# Patient Record
Sex: Male | Born: 1965 | Race: White | Hispanic: No | Marital: Single | Smoking: Never smoker
Health system: Southern US, Community
[De-identification: ages and names within clinical notes are randomized; demographics above are authoritative.]

## PROBLEM LIST (undated history)

## (undated) DIAGNOSIS — E079 Disorder of thyroid, unspecified: Secondary | ICD-10-CM

## (undated) DIAGNOSIS — K589 Irritable bowel syndrome without diarrhea: Secondary | ICD-10-CM

## (undated) DIAGNOSIS — K219 Gastro-esophageal reflux disease without esophagitis: Secondary | ICD-10-CM

## (undated) HISTORY — DX: Irritable bowel syndrome, unspecified: K58.9

## (undated) HISTORY — DX: Gastro-esophageal reflux disease without esophagitis: K21.9

## (undated) HISTORY — PX: THYROID LOBECTOMY: SHX420

## (undated) HISTORY — PX: APPENDECTOMY: SHX54

## (undated) HISTORY — DX: Disorder of thyroid, unspecified: E07.9

---

## 2003-09-21 ENCOUNTER — Other Ambulatory Visit: Admission: RE | Admit: 2003-09-21 | Discharge: 2003-09-21 | Payer: Self-pay | Admitting: Diagnostic Radiology

## 2003-10-13 ENCOUNTER — Observation Stay (HOSPITAL_COMMUNITY): Admission: RE | Admit: 2003-10-13 | Discharge: 2003-10-14 | Payer: Self-pay | Admitting: General Surgery

## 2003-10-13 ENCOUNTER — Encounter (INDEPENDENT_AMBULATORY_CARE_PROVIDER_SITE_OTHER): Payer: Self-pay | Admitting: Specialist

## 2004-07-04 ENCOUNTER — Encounter: Admission: RE | Admit: 2004-07-04 | Discharge: 2004-07-04 | Payer: Self-pay | Admitting: Family Medicine

## 2008-04-06 ENCOUNTER — Ambulatory Visit (HOSPITAL_BASED_OUTPATIENT_CLINIC_OR_DEPARTMENT_OTHER): Admission: RE | Admit: 2008-04-06 | Discharge: 2008-04-06 | Payer: Self-pay | Admitting: Orthopedic Surgery

## 2010-09-12 NOTE — Op Note (Signed)
Henry Ball, Henry Ball NO.:  [id number]   MEDICAL RECORD NO.:  [id number]          PATIENT TYPE:  AMB   LOCATION:  [facility]                         FACILITY:  [facility]   PHYSICIAN:  Deidre Ala, M.D.    DATE OF BIRTH:  1965-10-16   DATE OF PROCEDURE:  04/06/2008  DATE OF DISCHARGE:                               OPERATIVE REPORT   PREOPERATIVE DIAGNOSES:  1. Impingement syndrome right shoulder with type 2-3 acromion.  2. Osteoarthritis acromioclavicular joint.  3. Rule out partial-thickness rotator cuff tear.   POSTOPERATIVE DIAGNOSES:  1. Impingement syndrome right shoulder with type 2-3 acromion.  2. 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  3. Mild biceps tendinitis.  4. Extensive partial-thickness rotator cuff abrasion and tearing pass      critical zone.   SURGICAL PROCEDURES:  1. Right shoulder operative arthroscopy with subacromial arch      decompression acromioplasty.  2. Debridement and ablation of partial-thickness rotator cuff tear.  3. Arthroscopic distal clavicle resection.  4. Subdeltoid bursectomy.   SURGEON:  1. Charlesetta Shanks, M.D.   ASSISTANT:  Phineas Semen, P.A.C.   ANESTHESIA:  General with LMA with scalene nerve block.   CULTURES:  None.   DRAINS:  None.   BLOOD LOSS:  Minimal.   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  He had shoulder pain starting a year and a half ago.  We have injected him with cortisone and Marcaine.  The pain has been  getting worse with positive impingement signs, tenderness at the [facility]  joint, pain on adduction at the [facility] joint.  MRI scan showed supraspinatus  and infraspinatus tendinopathy without tear with mild acromioclavicular  degenerative change.  There was no through-and-through tear.  There was  no SLAP tear identified.  He desired to proceed and was approved by  worker's comp for surgery as he was on light duty work because of  this  with restrictions.  At surgery, he had no SLAP tear with some fraying.  The biceps tendon, when pulled into the joint, had some redness in the  bicipital groove but it was not severe enough to do tenotomy.  His  glenohumeral joint looked good.  The anterior acromion was sharp, hooked  and inflamed on the inferior leading edge over the CA ligament which was  prominent.  The [facility] joint was markedly end-stage osteoarthritic.  He had  thick subdeltoid bursa and extensive excoriation of the supraspinatus  tendon all over, past the critical zone with the impingement to probably  the superficial one-third but not through-and-through and intact to the  tuberosity.  We did SADDCR to Caspari margins.  We debrided the  subdeltoid bursa as well as smoothed with the ablator on one, the  rotator cuff and released the CA ligament.  We did some minor shaving on  the superior labrum.  Biceps tenotomy was not done.   PROCEDURE:  With adequate anesthesia obtained using endotracheal  technique and scalene block, the patient was placed in the supine beach  chair position.  The right shoulder was then prepped and draped in the  standard fashion.  After standard prepping and draping, skin markings  were made for anatomic positioning.  I then entered the shoulder through  posterior portal.  Anterior portal was established just lateral to the  coracoid.  The superior labral area was then probed and shaver was  brought in as well as ablator.  Portals reversed and similar shavings  carried out.  I then entered the subacromial space at the posterior  portal, lateral portal was established.  I then shaved the soft tissue  from the anterior undersurface of the acromion and lysed the CA ligament  using an electrocautery.  Bleeding points were cauterized.  Ablator was  used to cauterize the anterior undersurface of the acromion before  shaving.  I then brought in the 6 bur and completed acromioplasty of the  roof  of the subacromial space in the manner of Caspari.  The scope was  then turned medially sideways where, through the anterior portal, I  debrided the [facility] meniscus with basket.  Collene Mares was then brought in.  I  then brought in a 6 bur and completed distal clavicle resection 2  shaverbreadths in in the manner of Caspari.  I then entered the shoulder  from the lateral portal, completed acromioplasty back to the bicortical  bone in the manner of Caspari and further lightly shaved on the distal  clavicle.  The scope was turned downward and with neutral internal-  external rotation, I then shaved the bursa and smoothed with the ablator  on 1.  I shaved the partial-thickness rotator cuff tear to a stable  zone, again staying superficial but using the ablator on 1 to further  smooth.  When adequate decompression was carried out, the shoulder was  irrigated through the scope, 0.5% Marcaine was not used due to the  block.  The portals closed with 4-0 nylon.  A bulky sterile compressive  dressing was applied with sling.  The patient, having tolerated the  procedure well, was awakened, taken to the recovery room in satisfactory  condition to be discharged per outpatient routine, given Percocet for  pain and told to call the office for appointment for recheck tomorrow.      Deidre Ala, M.D.  Electronically Signed     VEP/MEDQ  D:  04/06/2008  T:  04/06/2008  Job:  [id number]

## 2010-09-15 NOTE — Op Note (Signed)
NAME:  Henry Ball, Henry Ball                        ACCOUNT NO.:  [id number]   MEDICAL RECORD NO.:  [id number]                   PATIENT TYPE:  AMB   LOCATION:  DAY                                  FACILITY:  [facility]   PHYSICIAN:  Gita Kudo, M.D.              DATE OF BIRTH:  11-13-1965   DATE OF PROCEDURE:  10/13/2003  DATE OF DISCHARGE:                                 OPERATIVE REPORT   OPERATIVE PROCEDURE:  Left thyroid lobectomy, frozen section.   SURGEON:  Gita Kudo, M.D.   ANESTHESIA:  General endotracheal.   PREOPERATIVE DIAGNOSIS:  Mass, left lower pole thyroid.   POSTOPERATIVE DIAGNOSIS:  Mass, left lower pole thyroid, probably benign by  frozen section.   CLINICAL SUMMARY:  A 45 year old male with lump in the neck.  No real  symptoms.  Work-up included exam, ultrasound, FNA - all inconclusive.  Comes  in for excision.   OPERATIVE FINDINGS:  The patient's right lobe of the thyroid gland looked  perfectly normal and felt normal.  The left lobe was enlarged with a  dominant nodule in the lower portion that really extended up to the upper  portion of the pole.  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   OPERATIVE PROCEDURE:  Under satisfactory general endotracheal anesthesia,  the patient's neck was prepped and draped in the standard fashion, and he  was positioned appropriately.  A transverse incision was made centered over  the mass and symmetrical on either side of midline.  Following this, the  cautery was used to develop flaps and good exposure obtained with a self-  retaining retractor.  The midline was opened and the strap muscles  retracted.  Bleeders were coagulated or tied with silk.  The nodule was  easily identified.  The lobe was retracted medially and with good exposure,  staying close to the gland; small branches were divided by cautery or  controlled with metal clips and divided.  Then, the inferior vein and middle  vein were divided between ties and clips.  The superior pole was taken down  and divided between 2-0 silk ties and clips also.  Then branches of the  inferior artery were identified and divided close to the gland between  clips.  At this point, the parathyroids and recurrent nerves were identified  and kept away from the operative field and injury prevented.  Then the gland  was removed from the trachea with the cautery over to the isthmus.  The  isthmus was controlled with a clamp and the gland divided, marked with  suture, and sent for pathologic exam.  The isthmus was controlled with  figure-of-eight 3-0 Vicryl suture.  Then the right side was examined  carefully, visually inspected and felt and felt normal.  While waiting for  pathology, the wound was made dry by cautery, infiltrated with Marcaine.  A  piece of Surgicel  placed on the  operative site and then the wound closed in layers with interrupted 3-0  Vicryl suture for midline, platysma, subcu.  When the report came back from  pathology, the wound skin edges were approximated with staples and Steri-  Strips.  A sterile absorbent dressing was applied, and the patient went to  the recovery room in good condition.                                               Gita Kudo, M.D.    MRL/MEDQ  D:  10/13/2003  T:  10/13/2003  Job:  [id number]   cc:   Chales Salmon. Abigail Miyamoto, M.D.  [address]  [place]  [place] [address]  Fax: [phone]

## 2011-02-02 LAB — POCT HEMOGLOBIN-HEMACUE: Hemoglobin: 16.1 g/dL (ref 13.0–17.0)

## 2011-06-14 ENCOUNTER — Other Ambulatory Visit: Payer: Self-pay | Admitting: Family Medicine

## 2011-06-14 DIAGNOSIS — J329 Chronic sinusitis, unspecified: Secondary | ICD-10-CM

## 2011-06-25 ENCOUNTER — Ambulatory Visit
Admission: RE | Admit: 2011-06-25 | Discharge: 2011-06-25 | Disposition: A | Discharge status: Home / Self Care | Payer: Federal, State, Local not specified - PPO | Source: Ambulatory Visit | Attending: Family Medicine | Admitting: Family Medicine

## 2011-06-25 DIAGNOSIS — J329 Chronic sinusitis, unspecified: Secondary | ICD-10-CM

## 2011-11-02 ENCOUNTER — Other Ambulatory Visit: Payer: Self-pay | Admitting: Family Medicine

## 2011-11-02 DIAGNOSIS — R200 Anesthesia of skin: Secondary | ICD-10-CM

## 2011-11-02 DIAGNOSIS — M79609 Pain in unspecified limb: Secondary | ICD-10-CM

## 2011-11-05 ENCOUNTER — Other Ambulatory Visit: Payer: Self-pay | Admitting: Family Medicine

## 2011-11-05 DIAGNOSIS — M79609 Pain in unspecified limb: Secondary | ICD-10-CM

## 2011-11-09 ENCOUNTER — Other Ambulatory Visit: Payer: Federal, State, Local not specified - PPO

## 2014-01-08 ENCOUNTER — Encounter: Payer: Self-pay | Admitting: *Deleted

## 2014-03-11 ENCOUNTER — Other Ambulatory Visit: Payer: Self-pay | Admitting: Family Medicine

## 2014-03-11 ENCOUNTER — Ambulatory Visit
Admission: RE | Admit: 2014-03-11 | Discharge: 2014-03-11 | Disposition: A | Discharge status: Home / Self Care | Payer: Federal, State, Local not specified - PPO | Source: Ambulatory Visit | Attending: Family Medicine | Admitting: Family Medicine

## 2014-03-11 DIAGNOSIS — M79672 Pain in left foot: Secondary | ICD-10-CM

## 2015-10-24 DIAGNOSIS — L739 Follicular disorder, unspecified: Secondary | ICD-10-CM | POA: Diagnosis not present

## 2015-10-24 DIAGNOSIS — L259 Unspecified contact dermatitis, unspecified cause: Secondary | ICD-10-CM | POA: Diagnosis not present

## 2015-12-01 DIAGNOSIS — K589 Irritable bowel syndrome without diarrhea: Secondary | ICD-10-CM | POA: Diagnosis not present

## 2015-12-16 DIAGNOSIS — H18223 Idiopathic corneal edema, bilateral: Secondary | ICD-10-CM | POA: Diagnosis not present

## 2016-02-02 DIAGNOSIS — M79606 Pain in leg, unspecified: Secondary | ICD-10-CM | POA: Diagnosis not present

## 2016-02-14 DIAGNOSIS — L821 Other seborrheic keratosis: Secondary | ICD-10-CM | POA: Diagnosis not present

## 2016-02-14 DIAGNOSIS — L814 Other melanin hyperpigmentation: Secondary | ICD-10-CM | POA: Diagnosis not present

## 2016-02-14 DIAGNOSIS — L281 Prurigo nodularis: Secondary | ICD-10-CM | POA: Diagnosis not present

## 2016-02-14 DIAGNOSIS — D485 Neoplasm of uncertain behavior of skin: Secondary | ICD-10-CM | POA: Diagnosis not present

## 2016-02-14 DIAGNOSIS — L309 Dermatitis, unspecified: Secondary | ICD-10-CM | POA: Diagnosis not present

## 2016-02-14 DIAGNOSIS — D1801 Hemangioma of skin and subcutaneous tissue: Secondary | ICD-10-CM | POA: Diagnosis not present

## 2016-02-28 DIAGNOSIS — L300 Nummular dermatitis: Secondary | ICD-10-CM | POA: Diagnosis not present

## 2016-05-01 DIAGNOSIS — L821 Other seborrheic keratosis: Secondary | ICD-10-CM | POA: Diagnosis not present

## 2016-05-01 DIAGNOSIS — L300 Nummular dermatitis: Secondary | ICD-10-CM | POA: Diagnosis not present

## 2016-05-03 DIAGNOSIS — R05 Cough: Secondary | ICD-10-CM | POA: Diagnosis not present

## 2016-05-11 DIAGNOSIS — J321 Chronic frontal sinusitis: Secondary | ICD-10-CM | POA: Diagnosis not present

## 2016-10-26 DIAGNOSIS — E786 Lipoprotein deficiency: Secondary | ICD-10-CM | POA: Diagnosis not present

## 2016-10-26 DIAGNOSIS — E291 Testicular hypofunction: Secondary | ICD-10-CM | POA: Diagnosis not present

## 2016-10-26 DIAGNOSIS — I10 Essential (primary) hypertension: Secondary | ICD-10-CM | POA: Diagnosis not present

## 2016-11-02 DIAGNOSIS — Z0000 Encounter for general adult medical examination without abnormal findings: Secondary | ICD-10-CM | POA: Diagnosis not present

## 2016-11-02 DIAGNOSIS — R944 Abnormal results of kidney function studies: Secondary | ICD-10-CM | POA: Diagnosis not present

## 2016-11-16 DIAGNOSIS — Z1211 Encounter for screening for malignant neoplasm of colon: Secondary | ICD-10-CM | POA: Diagnosis not present

## 2016-11-16 DIAGNOSIS — Z01818 Encounter for other preprocedural examination: Secondary | ICD-10-CM | POA: Diagnosis not present

## 2016-11-29 DIAGNOSIS — N289 Disorder of kidney and ureter, unspecified: Secondary | ICD-10-CM | POA: Diagnosis not present

## 2016-11-29 DIAGNOSIS — I10 Essential (primary) hypertension: Secondary | ICD-10-CM | POA: Diagnosis not present

## 2016-11-30 ENCOUNTER — Other Ambulatory Visit: Payer: Self-pay | Admitting: Family Medicine

## 2016-11-30 DIAGNOSIS — N289 Disorder of kidney and ureter, unspecified: Secondary | ICD-10-CM

## 2016-12-03 DIAGNOSIS — K635 Polyp of colon: Secondary | ICD-10-CM | POA: Diagnosis not present

## 2016-12-03 DIAGNOSIS — Z1211 Encounter for screening for malignant neoplasm of colon: Secondary | ICD-10-CM | POA: Diagnosis not present

## 2016-12-03 DIAGNOSIS — K219 Gastro-esophageal reflux disease without esophagitis: Secondary | ICD-10-CM | POA: Diagnosis not present

## 2016-12-03 DIAGNOSIS — K2930 Chronic superficial gastritis without bleeding: Secondary | ICD-10-CM | POA: Diagnosis not present

## 2016-12-03 DIAGNOSIS — D126 Benign neoplasm of colon, unspecified: Secondary | ICD-10-CM | POA: Diagnosis not present

## 2016-12-03 DIAGNOSIS — K640 First degree hemorrhoids: Secondary | ICD-10-CM | POA: Diagnosis not present

## 2016-12-04 ENCOUNTER — Other Ambulatory Visit: Payer: Self-pay | Admitting: Gastroenterology

## 2016-12-04 DIAGNOSIS — K6389 Other specified diseases of intestine: Secondary | ICD-10-CM

## 2016-12-06 DIAGNOSIS — B349 Viral infection, unspecified: Secondary | ICD-10-CM | POA: Diagnosis not present

## 2016-12-06 DIAGNOSIS — J309 Allergic rhinitis, unspecified: Secondary | ICD-10-CM | POA: Diagnosis not present

## 2016-12-07 DIAGNOSIS — K635 Polyp of colon: Secondary | ICD-10-CM | POA: Diagnosis not present

## 2016-12-07 DIAGNOSIS — D126 Benign neoplasm of colon, unspecified: Secondary | ICD-10-CM | POA: Diagnosis not present

## 2016-12-07 DIAGNOSIS — K2930 Chronic superficial gastritis without bleeding: Secondary | ICD-10-CM | POA: Diagnosis not present

## 2016-12-07 DIAGNOSIS — Z1211 Encounter for screening for malignant neoplasm of colon: Secondary | ICD-10-CM | POA: Diagnosis not present

## 2016-12-10 ENCOUNTER — Ambulatory Visit
Admission: RE | Admit: 2016-12-10 | Discharge: 2016-12-10 | Disposition: A | Discharge status: Home / Self Care | Payer: Federal, State, Local not specified - PPO | Source: Ambulatory Visit | Attending: Family Medicine | Admitting: Family Medicine

## 2016-12-10 DIAGNOSIS — R944 Abnormal results of kidney function studies: Secondary | ICD-10-CM | POA: Diagnosis not present

## 2016-12-10 DIAGNOSIS — N289 Disorder of kidney and ureter, unspecified: Secondary | ICD-10-CM

## 2016-12-21 ENCOUNTER — Ambulatory Visit
Admission: RE | Admit: 2016-12-21 | Discharge: 2016-12-21 | Disposition: A | Discharge status: Home / Self Care | Payer: Federal, State, Local not specified - PPO | Source: Ambulatory Visit | Attending: Gastroenterology | Admitting: Gastroenterology

## 2016-12-21 DIAGNOSIS — K6389 Other specified diseases of intestine: Secondary | ICD-10-CM

## 2016-12-21 DIAGNOSIS — K7689 Other specified diseases of liver: Secondary | ICD-10-CM | POA: Diagnosis not present

## 2016-12-21 MED ORDER — IOPAMIDOL (ISOVUE-300) INJECTION 61%
100.0000 mL | Freq: Once | INTRAVENOUS | Status: AC | PRN
  Administered 2016-12-21: 100 mL via INTRAVENOUS

## 2016-12-28 DIAGNOSIS — J0100 Acute maxillary sinusitis, unspecified: Secondary | ICD-10-CM | POA: Diagnosis not present

## 2016-12-28 DIAGNOSIS — I10 Essential (primary) hypertension: Secondary | ICD-10-CM | POA: Diagnosis not present

## 2016-12-28 DIAGNOSIS — N289 Disorder of kidney and ureter, unspecified: Secondary | ICD-10-CM | POA: Diagnosis not present

## 2017-02-27 DIAGNOSIS — Z79899 Other long term (current) drug therapy: Secondary | ICD-10-CM | POA: Diagnosis not present

## 2017-03-26 DIAGNOSIS — I10 Essential (primary) hypertension: Secondary | ICD-10-CM | POA: Diagnosis not present

## 2017-07-03 DIAGNOSIS — E039 Hypothyroidism, unspecified: Secondary | ICD-10-CM | POA: Diagnosis not present

## 2017-07-03 DIAGNOSIS — I129 Hypertensive chronic kidney disease with stage 1 through stage 4 chronic kidney disease, or unspecified chronic kidney disease: Secondary | ICD-10-CM | POA: Diagnosis not present

## 2017-07-03 DIAGNOSIS — K219 Gastro-esophageal reflux disease without esophagitis: Secondary | ICD-10-CM | POA: Diagnosis not present

## 2017-07-03 DIAGNOSIS — N183 Chronic kidney disease, stage 3 (moderate): Secondary | ICD-10-CM | POA: Diagnosis not present

## 2017-09-19 DIAGNOSIS — J309 Allergic rhinitis, unspecified: Secondary | ICD-10-CM | POA: Diagnosis not present

## 2017-09-19 DIAGNOSIS — E039 Hypothyroidism, unspecified: Secondary | ICD-10-CM | POA: Diagnosis not present

## 2017-09-19 DIAGNOSIS — I10 Essential (primary) hypertension: Secondary | ICD-10-CM | POA: Diagnosis not present

## 2017-09-19 DIAGNOSIS — N289 Disorder of kidney and ureter, unspecified: Secondary | ICD-10-CM | POA: Diagnosis not present

## 2017-09-19 DIAGNOSIS — L918 Other hypertrophic disorders of the skin: Secondary | ICD-10-CM | POA: Diagnosis not present

## 2018-01-04 DIAGNOSIS — M545 Low back pain: Secondary | ICD-10-CM | POA: Diagnosis not present

## 2018-01-17 DIAGNOSIS — Z13220 Encounter for screening for lipoid disorders: Secondary | ICD-10-CM | POA: Diagnosis not present

## 2018-01-17 DIAGNOSIS — Z0000 Encounter for general adult medical examination without abnormal findings: Secondary | ICD-10-CM | POA: Diagnosis not present

## 2018-01-23 DIAGNOSIS — I10 Essential (primary) hypertension: Secondary | ICD-10-CM | POA: Diagnosis not present

## 2018-01-23 DIAGNOSIS — E039 Hypothyroidism, unspecified: Secondary | ICD-10-CM | POA: Diagnosis not present

## 2018-01-23 DIAGNOSIS — Z23 Encounter for immunization: Secondary | ICD-10-CM | POA: Diagnosis not present

## 2018-01-23 DIAGNOSIS — E786 Lipoprotein deficiency: Secondary | ICD-10-CM | POA: Diagnosis not present

## 2018-01-23 DIAGNOSIS — Z0000 Encounter for general adult medical examination without abnormal findings: Secondary | ICD-10-CM | POA: Diagnosis not present

## 2018-02-04 DIAGNOSIS — I129 Hypertensive chronic kidney disease with stage 1 through stage 4 chronic kidney disease, or unspecified chronic kidney disease: Secondary | ICD-10-CM | POA: Diagnosis not present

## 2018-02-04 DIAGNOSIS — E291 Testicular hypofunction: Secondary | ICD-10-CM | POA: Diagnosis not present

## 2018-02-04 DIAGNOSIS — N183 Chronic kidney disease, stage 3 (moderate): Secondary | ICD-10-CM | POA: Diagnosis not present

## 2018-02-04 DIAGNOSIS — E039 Hypothyroidism, unspecified: Secondary | ICD-10-CM | POA: Diagnosis not present

## 2018-05-01 DIAGNOSIS — M79672 Pain in left foot: Secondary | ICD-10-CM | POA: Diagnosis not present

## 2018-05-01 DIAGNOSIS — K589 Irritable bowel syndrome without diarrhea: Secondary | ICD-10-CM | POA: Diagnosis not present

## 2018-05-01 DIAGNOSIS — I10 Essential (primary) hypertension: Secondary | ICD-10-CM | POA: Diagnosis not present

## 2018-05-01 DIAGNOSIS — J309 Allergic rhinitis, unspecified: Secondary | ICD-10-CM | POA: Diagnosis not present

## 2018-05-13 ENCOUNTER — Ambulatory Visit: Payer: Federal, State, Local not specified - PPO | Admitting: Podiatry

## 2018-05-13 ENCOUNTER — Ambulatory Visit (INDEPENDENT_AMBULATORY_CARE_PROVIDER_SITE_OTHER): Payer: Federal, State, Local not specified - PPO

## 2018-05-13 ENCOUNTER — Encounter: Payer: Self-pay | Admitting: Podiatry

## 2018-05-13 DIAGNOSIS — M7741 Metatarsalgia, right foot: Secondary | ICD-10-CM

## 2018-05-13 DIAGNOSIS — M779 Enthesopathy, unspecified: Secondary | ICD-10-CM

## 2018-05-13 DIAGNOSIS — Q666 Other congenital valgus deformities of feet: Secondary | ICD-10-CM

## 2018-05-13 DIAGNOSIS — M7742 Metatarsalgia, left foot: Secondary | ICD-10-CM | POA: Diagnosis not present

## 2018-05-13 NOTE — Progress Notes (Signed)
   Subjective:    Patient ID: Henry Ball, male    DOB: 09-12-1965, 53 y.o.   MRN: [id number]  HPI 53 year old male presents the office today for concerns of bilateral foot pain.  He states the pain started he is in the [employer] and he is getting pain in the ball of his foot.  He was given some pads which were helpful.  This is been a reoccurring intermittent issue for some time.  Around [phone] he states that he kept on a gumball twisting his foot and he had some pain after that and eventually resolved.  He now works at the post office on concrete floors.  He exercises barefoot makes his feet feel better but standing all day causes discomfort in the balls of his feet.  Denies any recent injury or trauma and denies any swelling or redness.  Has no other concerns.   Review of Systems  All other systems reviewed and are negative.  Past Medical History:  Diagnosis Date  . GERD (gastroesophageal reflux disease)   . IBS (irritable bowel syndrome)   . Thyroid disease     Past Surgical History:  Procedure Laterality Date  . APPENDECTOMY    . THYROID LOBECTOMY       Current Outpatient Medications:  Marland Kitchen  Glucosamine HCl (GLUCOSAMINE PO), Take by mouth., Disp: , Rfl:  .  hydrochlorothiazide (MICROZIDE) 12.5 MG capsule, Take 12.5 mg by mouth daily., Disp: , Rfl:  .  LOSARTAN POTASSIUM PO, Take by mouth., Disp: , Rfl:  .  potassium chloride (K-DUR) 10 MEQ tablet, Take 10 mEq by mouth daily., Disp: , Rfl:  .  levothyroxine (SYNTHROID, LEVOTHROID) 88 MCG tablet, Take 88 mcg by mouth daily before breakfast., Disp: , Rfl:   Allergies  Allergen Reactions  . Morphine And Related          Objective:   Physical Exam  General: AAO x3, NAD  Dermatological: Skin is warm, dry and supple bilateral. Nails x 10 are well manicured; remaining integument appears unremarkable at this time. There are no open sores, no preulcerative lesions, no rash or signs of infection present.  Vascular:  Dorsalis Pedis artery and Posterior Tibial artery pedal pulses are 2/4 bilateral with immedate capillary fill time. Pedal hair growth present. No varicosities and no lower extremity edema present bilateral. There is no pain with calf compression, swelling, warmth, erythema.   Neruologic: Grossly intact via light touch bilateral. . Protective threshold with Semmes Wienstein monofilament intact to all pedal sites bilateral.  Negative Tinel sign  Musculoskeletal: There is prominence of metatarsal heads plantarly with mild atrophy at that time there is diffuse tenderness submetatarsal area there is no pain on the dorsal MPJs.  No pain with MPJ range of motion crepitation.  No other area of pinpoint bony tenderness identified today.  There is about 3 degrees of rear foot valgus present.  Muscular strength 5/5 in all groups tested bilateral.  Gait: Unassisted, Nonantalgic.     Assessment & Plan:  53 year old male with metatarsalgia, capsulitis with flatfoot deformity -Treatment options discussed including all alternatives, risks, and complications -Etiology of symptoms were discussed -I do think long-term he will benefit from orthotics help support the arch of the foot as well as the metatarsal pads.  Raiford Noble evaluated him today he was measured for orthotics.  In the meantime offloading pads were dispensed.  Discussed shoe modifications as well.  Vivi Barrack DPM

## 2018-06-03 ENCOUNTER — Ambulatory Visit: Payer: Federal, State, Local not specified - PPO | Admitting: Orthotics

## 2018-06-03 DIAGNOSIS — M7742 Metatarsalgia, left foot: Secondary | ICD-10-CM

## 2018-06-03 DIAGNOSIS — M779 Enthesopathy, unspecified: Secondary | ICD-10-CM

## 2018-06-03 DIAGNOSIS — M7741 Metatarsalgia, right foot: Secondary | ICD-10-CM

## 2018-06-03 IMAGING — CT CT ABD-PELV W/ CM
2 of 5 series · 16 of 46 positions shown, 18 images · IV contrast (APPLIED)
Comparison: None.

CLINICAL DATA: Abnormal colonoscopy in with indeterminate nodule
seen at the appendix. History of appendectomy

EXAM:
CT ABDOMEN AND PELVIS WITH CONTRAST
TECHNIQUE: Multidetector CT imaging of the abdomen and pelvis was performed
using the standard protocol following bolus administration of
intravenous contrast.
CONTRAST:  100mL 65V3JM-YFF IOPAMIDOL (65V3JM-YFF) INJECTION 61%

[Series 2: abd/pelvis w/cm · axial · 0.81mm/px · z∈[-646,-166]mm · 13 of 108 slices shown, 15 images]
[im 6/108  soft-tissue]
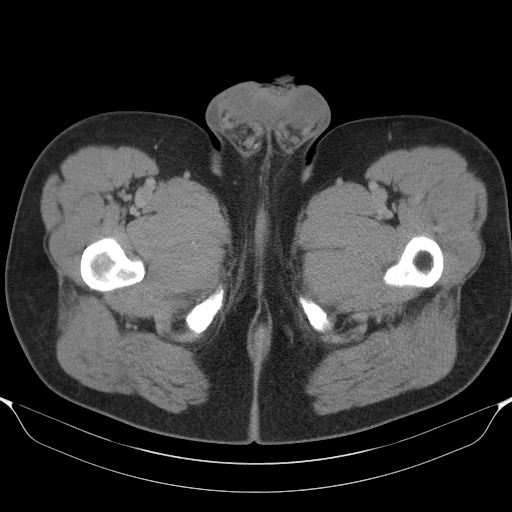
[im 6/108  bone]
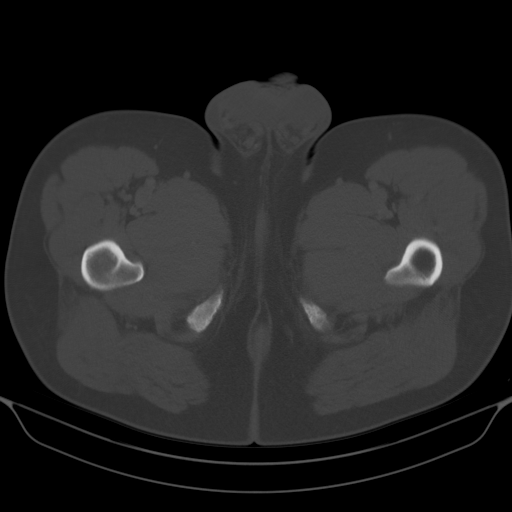
[im 17/108  soft-tissue]
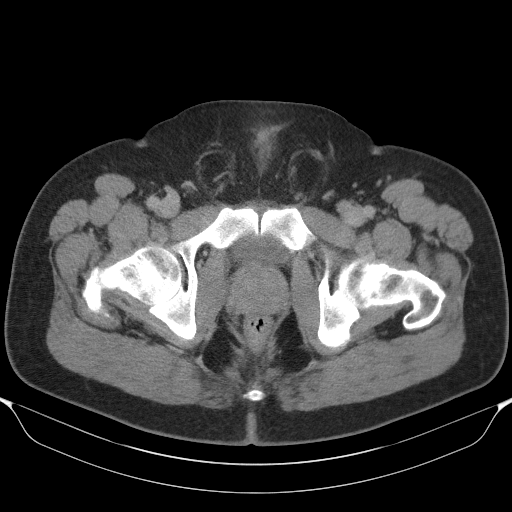
[im 23/108  soft-tissue]
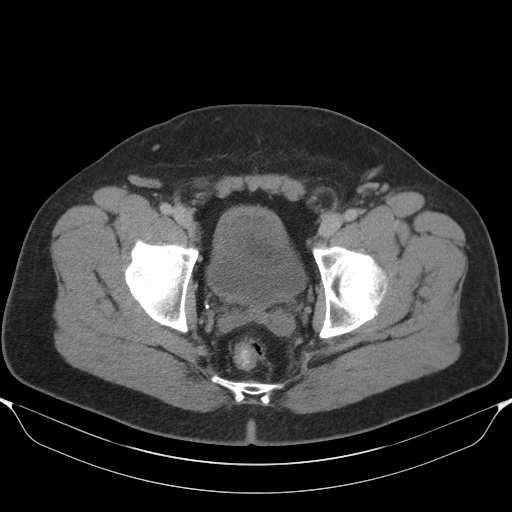
[im 29/108  soft-tissue]
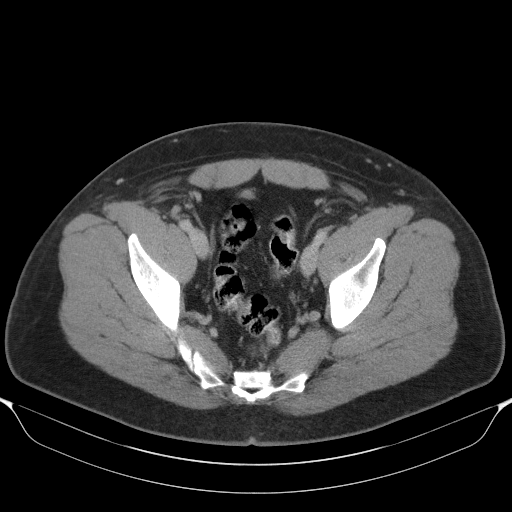
[im 40/108  soft-tissue]
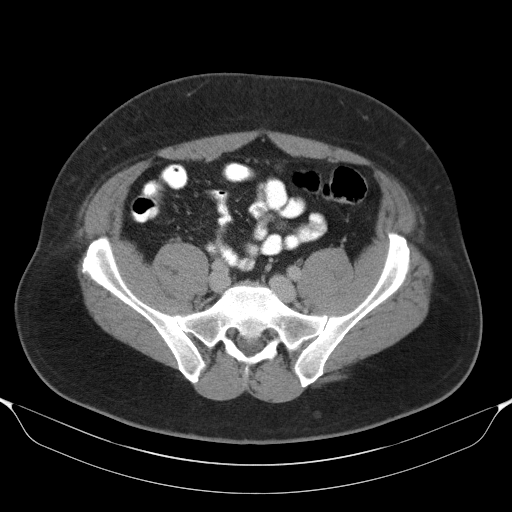
[im 46/108  soft-tissue]
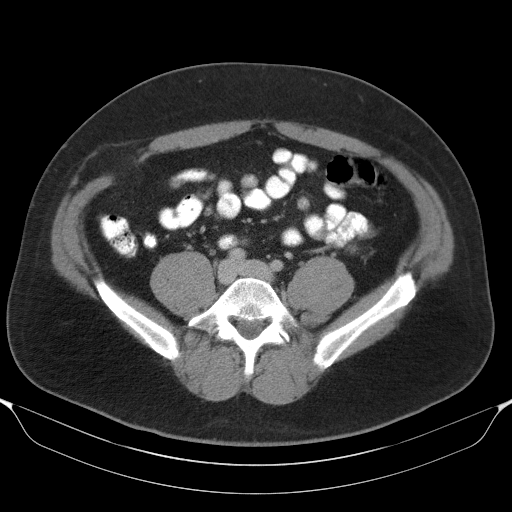
[im 57/108  soft-tissue]
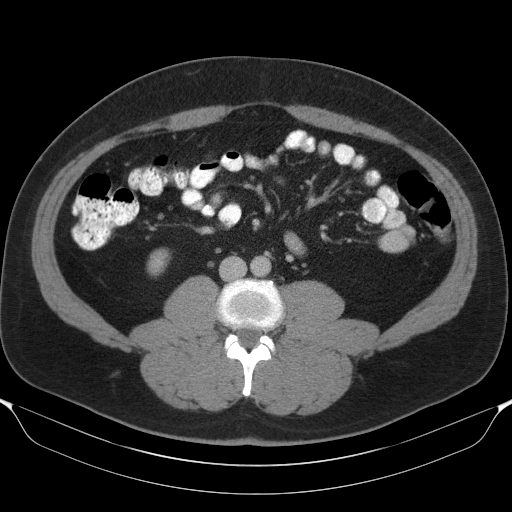
[im 62/108  soft-tissue]
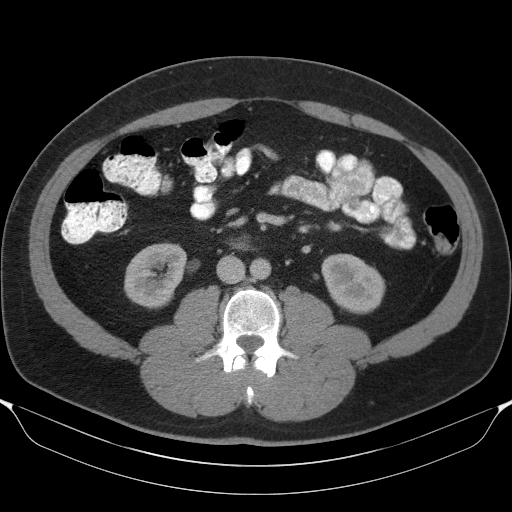
[im 68/108  soft-tissue]
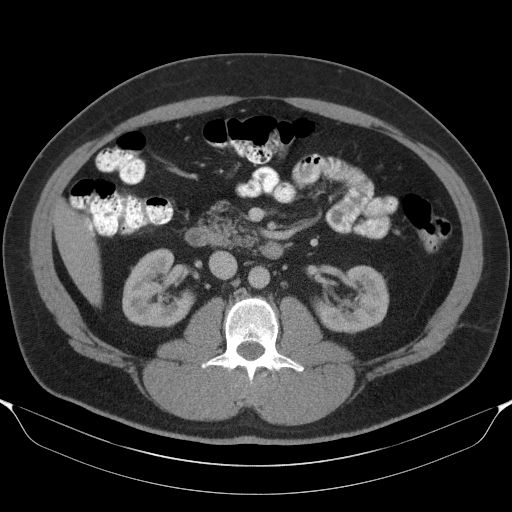
[im 68/108  bone]
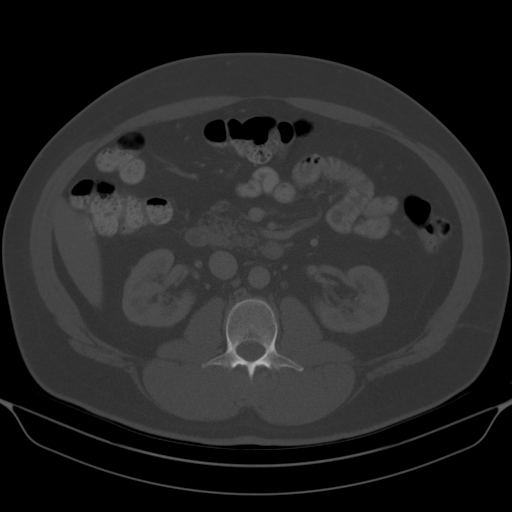
[im 79/108  soft-tissue]
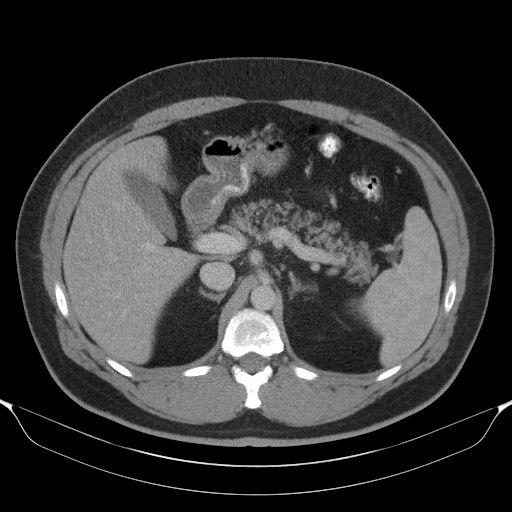
[im 85/108  soft-tissue]
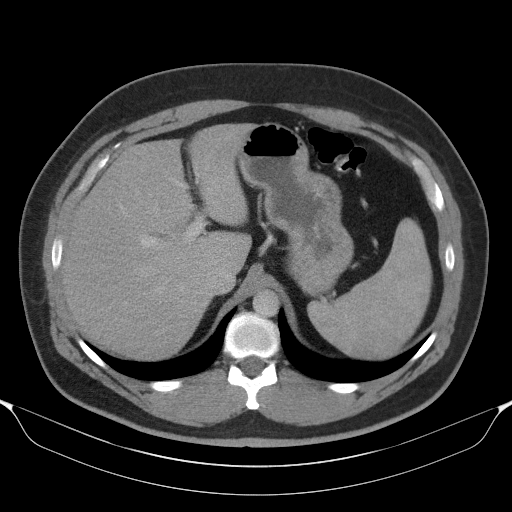
[im 91/108  soft-tissue]
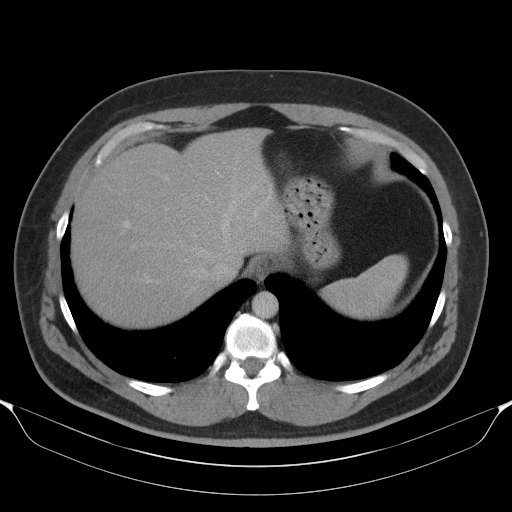
[im 102/108  soft-tissue]
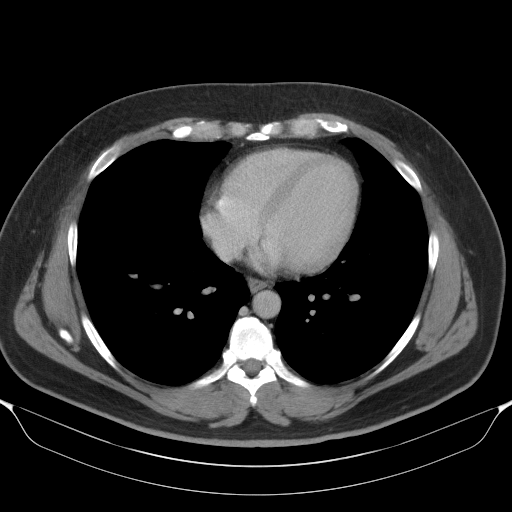

[Series 3: cor · coronal · 0.79mm/px · 3 of 110 slices shown]
[im 37/110  soft-tissue]
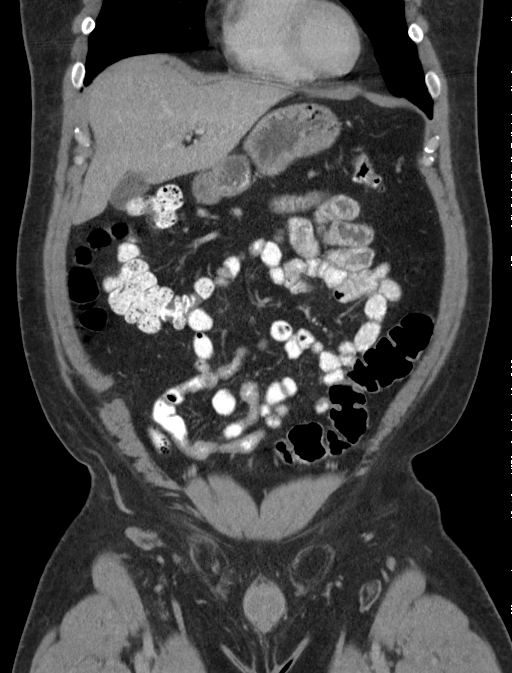
[im 49/110  soft-tissue]
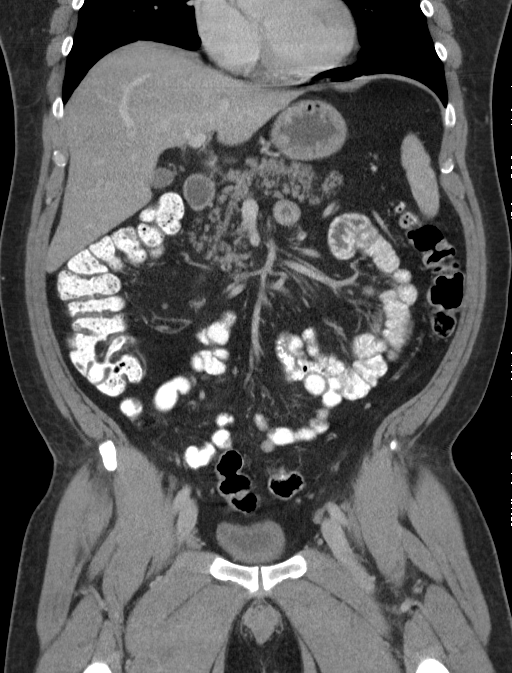
[im 61/110  soft-tissue]
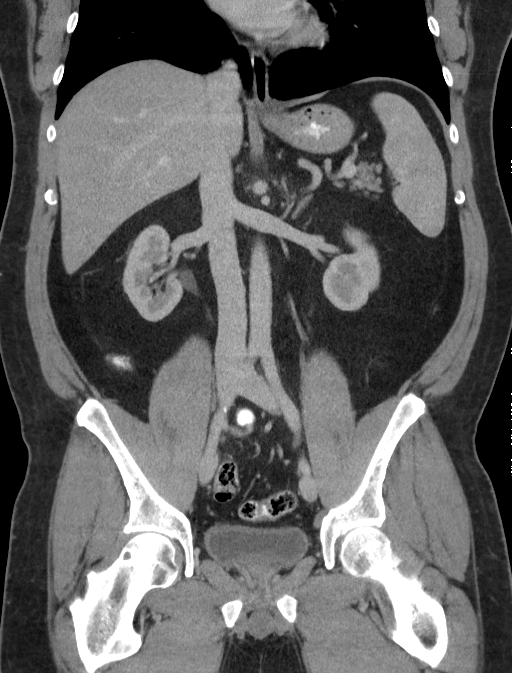

[16 of 46 positions shown; findings below may reference images not displayed]

FINDINGS: Lower chest: Pain

Hepatobiliary: Relative low-density lesion in the LEFT hepatic lobe
cannot be fully characterize simple cysts but favored a such normal
gallbladder.

Pancreas: Pancreas is normal. No ductal dilatation. No pancreatic
inflammation.

Spleen: Normal spleen

Adrenals/urinary tract: Adrenal glands and kidneys are normal. The
ureters and bladder normal.

Stomach/Bowel: Stomach, duodenum, small-bowel and terminal ileum are
normal. Fatty ileocecal valve noted. Post appendectomy. No discrete
lesion identified in the cecum. Normal cecal folds present. No bowel
obstruction. Typical small lymph nodes in the ileocecal mesenteries
largest measuring 10 mm (image 50, series 2).

The ascending, transverse and descending colon are normal.
Rectosigmoid colon normal

Vascular/Lymphatic: Abdominal aorta is normal caliber. There is no
retroperitoneal or periportal lymphadenopathy. No pelvic
lymphadenopathy.

Reproductive: Prostate normal

Other: Bilateral fat filled inguinal hernias.

Musculoskeletal: No aggressive osseous lesion.
IMPRESSION: 1. No abnormality of the cecum post appendectomy.
2. Normal ileocecal valve.
3. Typical small lymph nodes in the ileocecal mesentery.
4. Low-density lesion in the LEFT hepatic lobe not fully
characterized but favored benign hepatic cyst.

## 2018-06-03 NOTE — Progress Notes (Signed)
Patient came in today to pick up custom made foot orthotics.  The goals were accomplished and the patient reported no dissatisfaction with said orthotics.  Patient was advised of breakin period and how to report any issues. 

## 2019-01-30 DIAGNOSIS — Z8042 Family history of malignant neoplasm of prostate: Secondary | ICD-10-CM | POA: Diagnosis not present

## 2019-01-30 DIAGNOSIS — Z0000 Encounter for general adult medical examination without abnormal findings: Secondary | ICD-10-CM | POA: Diagnosis not present

## 2019-01-30 DIAGNOSIS — Z6833 Body mass index (BMI) 33.0-33.9, adult: Secondary | ICD-10-CM | POA: Diagnosis not present

## 2019-01-30 DIAGNOSIS — E039 Hypothyroidism, unspecified: Secondary | ICD-10-CM | POA: Diagnosis not present

## 2019-01-30 DIAGNOSIS — E669 Obesity, unspecified: Secondary | ICD-10-CM | POA: Diagnosis not present

## 2019-02-04 DIAGNOSIS — Z23 Encounter for immunization: Secondary | ICD-10-CM | POA: Diagnosis not present

## 2019-02-04 DIAGNOSIS — Z0000 Encounter for general adult medical examination without abnormal findings: Secondary | ICD-10-CM | POA: Diagnosis not present

## 2019-02-04 DIAGNOSIS — I10 Essential (primary) hypertension: Secondary | ICD-10-CM | POA: Diagnosis not present

## 2019-05-07 DIAGNOSIS — E039 Hypothyroidism, unspecified: Secondary | ICD-10-CM | POA: Diagnosis not present

## 2019-05-11 DIAGNOSIS — Z20828 Contact with and (suspected) exposure to other viral communicable diseases: Secondary | ICD-10-CM | POA: Diagnosis not present

## 2019-07-04 ENCOUNTER — Ambulatory Visit: Payer: Federal, State, Local not specified - PPO

## 2019-07-04 ENCOUNTER — Ambulatory Visit: Payer: Federal, State, Local not specified - PPO | Attending: Internal Medicine

## 2019-07-04 DIAGNOSIS — Z23 Encounter for immunization: Secondary | ICD-10-CM | POA: Insufficient documentation

## 2019-07-04 NOTE — Progress Notes (Signed)
   Covid-19 Vaccination Clinic  Name:  Henry Ball    MRN: [id number] DOB: November 21, 1965  07/04/2019  Henry Ball was observed post Covid-19 immunization for 15 minutes without incident. He was provided with Vaccine Information Sheet and instruction to access the V-Safe system.   Henry Ball was instructed to call 911 with any severe reactions post vaccine: Marland Kitchen Difficulty breathing  . Swelling of face and throat  . A fast heartbeat  . A bad rash all over body  . Dizziness and weakness   Immunizations Administered    Name Date Dose VIS Date Route   Pfizer COVID-19 Vaccine 07/04/2019 12:17 PM 0.3 mL 04/10/2019 Intramuscular   Manufacturer: [employer], [employer]   Lot: [id number]   NDC: [address]-1

## 2019-07-25 ENCOUNTER — Ambulatory Visit: Payer: Federal, State, Local not specified - PPO

## 2019-08-04 ENCOUNTER — Ambulatory Visit: Payer: Federal, State, Local not specified - PPO

## 2019-08-04 ENCOUNTER — Ambulatory Visit: Payer: Federal, State, Local not specified - PPO | Attending: Internal Medicine

## 2019-08-04 DIAGNOSIS — Z23 Encounter for immunization: Secondary | ICD-10-CM

## 2019-08-04 NOTE — Progress Notes (Signed)
   Covid-19 Vaccination Clinic  Name:  Henry Ball    MRN: [id number] DOB: 1966-01-20  08/04/2019  [name] was observed post Covid-19 immunization for 15 minutes without incident. He was provided with Vaccine Information Sheet and instruction to access the V-Safe system.   [name] was instructed to call 911 with any severe reactions post vaccine: Marland Kitchen Difficulty breathing  . Swelling of face and throat  . A fast heartbeat  . A bad rash all over body  . Dizziness and weakness   Immunizations Administered    Name Date Dose VIS Date Route   Pfizer COVID-19 Vaccine 08/04/2019 10:50 AM 0.3 mL 04/10/2019 Intramuscular   Manufacturer: [employer], [employer]   Lot: [id number]   NDC: [address]-3

## 2019-10-29 DIAGNOSIS — M25461 Effusion, right knee: Secondary | ICD-10-CM | POA: Diagnosis not present

## 2019-10-29 DIAGNOSIS — M1711 Unilateral primary osteoarthritis, right knee: Secondary | ICD-10-CM | POA: Diagnosis not present

## 2019-11-18 DIAGNOSIS — M25461 Effusion, right knee: Secondary | ICD-10-CM | POA: Diagnosis not present

## 2020-02-04 DIAGNOSIS — I10 Essential (primary) hypertension: Secondary | ICD-10-CM | POA: Diagnosis not present

## 2020-02-04 DIAGNOSIS — Z13220 Encounter for screening for lipoid disorders: Secondary | ICD-10-CM | POA: Diagnosis not present

## 2020-02-04 DIAGNOSIS — Z125 Encounter for screening for malignant neoplasm of prostate: Secondary | ICD-10-CM | POA: Diagnosis not present

## 2020-02-12 DIAGNOSIS — Z0000 Encounter for general adult medical examination without abnormal findings: Secondary | ICD-10-CM | POA: Diagnosis not present

## 2020-04-14 ENCOUNTER — Encounter (INDEPENDENT_AMBULATORY_CARE_PROVIDER_SITE_OTHER): Payer: Self-pay | Admitting: Otolaryngology

## 2020-04-14 ENCOUNTER — Other Ambulatory Visit: Payer: Self-pay

## 2020-04-14 ENCOUNTER — Ambulatory Visit (INDEPENDENT_AMBULATORY_CARE_PROVIDER_SITE_OTHER): Payer: Federal, State, Local not specified - PPO | Admitting: Otolaryngology

## 2020-04-14 VITALS — Temp 97.7°F

## 2020-04-14 DIAGNOSIS — H9313 Tinnitus, bilateral: Secondary | ICD-10-CM | POA: Diagnosis not present

## 2020-04-14 DIAGNOSIS — H9312 Tinnitus, left ear: Secondary | ICD-10-CM | POA: Diagnosis not present

## 2020-04-14 DIAGNOSIS — H903 Sensorineural hearing loss, bilateral: Secondary | ICD-10-CM | POA: Diagnosis not present

## 2020-04-14 NOTE — Progress Notes (Signed)
HPI: Henry Ball is a 54 y.o. male who presents for evaluation of hearing problems.  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  He does not trust the hearing test and presents here to have audiologic testing.  He notices ringing in the left ear.  He has had more noise exposure to the left side.  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  Past Medical History:  Diagnosis Date  . GERD (gastroesophageal reflux disease)   . IBS (irritable bowel syndrome)   . Thyroid disease    Past Surgical History:  Procedure Laterality Date  . APPENDECTOMY    . THYROID LOBECTOMY     Social History   Socioeconomic History  . Marital status: Single    Spouse name: Not on file  . Number of children: Not on file  . Years of education: Not on file  . Highest education level: Not on file  Occupational History  . Not on file  Tobacco Use  . Smoking status: Never Smoker  . Smokeless tobacco: Never Used  Substance and Sexual Activity  . Alcohol use: No  . Drug use: No  . Sexual activity: Not on file  Other Topics Concern  . Not on file  Social History Narrative  . Not on file   Social Determinants of Health   Financial Resource Strain: Not on file  Food Insecurity: Not on file  Transportation Needs: Not on file  Physical Activity: Not on file  Stress: Not on file  Social Connections: Not on file   No family history on file. No Known Allergies Prior to Admission medications   Medication Sig Start Date End Date Taking? Authorizing Provider  Glucosamine HCl (GLUCOSAMINE PO) Take by mouth.    [provider]  hydrochlorothiazide (MICROZIDE) 12.5 MG capsule Take 12.5 mg by mouth daily.    [provider]  levothyroxine (SYNTHROID, LEVOTHROID) 88 MCG tablet Take 88 mcg by mouth daily before breakfast.    [provider]  LOSARTAN POTASSIUM PO Take by mouth.     [provider]  potassium chloride (K-DUR) 10 MEQ tablet Take 10 mEq by mouth daily.    [provider]     Positive ROS: Otherwise negative  All other systems have been reviewed and were otherwise negative with the exception of those mentioned in the HPI and as above.  Physical Exam: Constitutional: Alert, well-appearing, no acute distress Ears: External ears without lesions or tenderness. Ear canals are clear bilaterally with intact, clear TMs.  Nasal: External nose without lesions.. Clear nasal passages Oral: Lips and gums without lesions. Tongue and palate mucosa without lesions. Posterior oropharynx clear. Neck: No palpable adenopathy or masses Respiratory: Breathing comfortably  Skin: No facial/neck lesions or rash noted.  Audiogram demonstrated minimal change in his hearing compared to the baseline testing 1995.  He has high-frequency left ear sensorineural hearing loss above 4000 and mild high-frequency positional hearing loss in the right ear.  The lower and mid frequencies are normal bilaterally with SRT's of 15 dB on the right and 10 dB on the left.  He had type A tympanograms bilaterally.  Procedures  Assessment: Left ear tinnitus High-frequency sensorineural hearing loss which is stable  Plan: Recommend using ear protection when around loud noise. Discussed with him concerning minimal treatment options for tinnitus.  Discussed use of masking noise and also  gave him samples of Lipo flavonoid to try.  He will follow-up as needed  Narda Bonds, MD

## 2020-05-05 ENCOUNTER — Encounter (INDEPENDENT_AMBULATORY_CARE_PROVIDER_SITE_OTHER): Payer: Self-pay

## 2020-06-29 ENCOUNTER — Other Ambulatory Visit: Payer: Self-pay

## 2020-06-29 ENCOUNTER — Encounter: Payer: Self-pay | Admitting: Podiatry

## 2020-06-29 ENCOUNTER — Ambulatory Visit (INDEPENDENT_AMBULATORY_CARE_PROVIDER_SITE_OTHER): Payer: Federal, State, Local not specified - PPO

## 2020-06-29 ENCOUNTER — Ambulatory Visit: Payer: Federal, State, Local not specified - PPO | Admitting: Podiatry

## 2020-06-29 DIAGNOSIS — M722 Plantar fascial fibromatosis: Secondary | ICD-10-CM | POA: Diagnosis not present

## 2020-06-29 DIAGNOSIS — M7732 Calcaneal spur, left foot: Secondary | ICD-10-CM

## 2020-06-29 DIAGNOSIS — M79672 Pain in left foot: Secondary | ICD-10-CM

## 2020-07-01 ENCOUNTER — Encounter: Payer: Self-pay | Admitting: Podiatry

## 2020-07-01 NOTE — Progress Notes (Signed)
Subjective:  Patient ID: Henry Ball, male    DOB: [birth year],  MRN: [id number]  Chief Complaint  Patient presents with  . Foot Problem    Left foot pain- pt states he is not in any pain today- pt states he has been stretching and icing foot.     55 y.o. male presents with the above complaint.  Patient presents with complaint of left heel pain that has came out of nowhere the past few weeks.  However he is not having any pain today.  Patient states it was painful and has been stretching and icing it and wearing his orthotics which has helped some.  He is also try some Voltaren gel as well which helped a little bit.  He just wants to get it evaluated make sure that his plantar fasciitis has not come back.  He was treated in the past for it as well.  He already has orthotics and has been wearing them religiously.  He denies any other acute complaints.  He just wants to get it evaluated make sure that there is nothing else going on.   Review of Systems: Negative except as noted in the HPI. Denies N/V/F/Ch.  Past Medical History:  Diagnosis Date  . GERD (gastroesophageal reflux disease)   . IBS (irritable bowel syndrome)   . Thyroid disease     Current Outpatient Medications:  Marland Kitchen  Glucosamine HCl (GLUCOSAMINE PO), Take by mouth., Disp: , Rfl:  .  hydrochlorothiazide (MICROZIDE) 12.5 MG capsule, Take 12.5 mg by mouth daily., Disp: , Rfl:  .  levothyroxine (SYNTHROID, LEVOTHROID) 88 MCG tablet, Take 88 mcg by mouth daily before breakfast., Disp: , Rfl:  .  LOSARTAN POTASSIUM PO, Take by mouth., Disp: , Rfl:  .  potassium chloride (K-DUR) 10 MEQ tablet, Take 10 mEq by mouth daily., Disp: , Rfl:   Social History   Tobacco Use  Smoking Status Never Smoker  Smokeless Tobacco Never Used    No Known Allergies Objective:  There were no vitals filed for this visit. There is no height or weight on file to calculate BMI. Constitutional Well developed. Well nourished.  Vascular Dorsalis  pedis pulses palpable bilaterally. Posterior tibial pulses palpable bilaterally. Capillary refill normal to all digits.  No cyanosis or clubbing noted. Pedal hair growth normal.  Neurologic Normal speech. Oriented to person, place, and time. Epicritic sensation to light touch grossly present bilaterally.  Dermatologic Nails well groomed and normal in appearance. No open wounds. No skin lesions.  Orthopedic: Normal joint ROM without pain or crepitus bilaterally. No visible deformities. Tender to palpation at the calcaneal tuber left. No pain with calcaneal squeeze left. Ankle ROM diminished range of motion left. Silfverskiold Test: positive left.   Radiographs: Taken and reviewed. No acute fractures or dislocations. No evidence of stress fracture.  Plantar heel spur present. Posterior heel spur present.   Assessment:   1. Heel spur, left   2. Plantar fasciitis, left    Plan:  Patient was evaluated and treated and all questions answered.  Plantar Fasciitis, left - XR reviewed as above.  - Educated on icing and stretching. Instructions given.  -No injection was given as patient does not have any clinical pain of plantar fasciitis - DME: Plantar Fascial Brace and night splints were dispensed.  No - Pharmacologic management: None -If his pain recurs then we will discuss doing a steroid injection at that time.  Patient states understanding -He already has orthotics I encouraged him to utilize them  at all times.  Also discussed shoe gear modification with him as well.  He states understanding  No follow-ups on file.

## 2021-02-17 DIAGNOSIS — Z125 Encounter for screening for malignant neoplasm of prostate: Secondary | ICD-10-CM | POA: Diagnosis not present

## 2021-02-17 DIAGNOSIS — E039 Hypothyroidism, unspecified: Secondary | ICD-10-CM | POA: Diagnosis not present

## 2021-02-17 DIAGNOSIS — N400 Enlarged prostate without lower urinary tract symptoms: Secondary | ICD-10-CM | POA: Diagnosis not present

## 2021-02-17 DIAGNOSIS — N1831 Chronic kidney disease, stage 3a: Secondary | ICD-10-CM | POA: Diagnosis not present

## 2021-02-17 DIAGNOSIS — K219 Gastro-esophageal reflux disease without esophagitis: Secondary | ICD-10-CM | POA: Diagnosis not present

## 2021-02-17 DIAGNOSIS — I10 Essential (primary) hypertension: Secondary | ICD-10-CM | POA: Diagnosis not present

## 2021-02-17 DIAGNOSIS — Z0000 Encounter for general adult medical examination without abnormal findings: Secondary | ICD-10-CM | POA: Diagnosis not present

## 2021-02-17 DIAGNOSIS — E786 Lipoprotein deficiency: Secondary | ICD-10-CM | POA: Diagnosis not present

## 2021-08-11 DIAGNOSIS — R21 Rash and other nonspecific skin eruption: Secondary | ICD-10-CM | POA: Diagnosis not present

## 2021-08-23 DIAGNOSIS — E669 Obesity, unspecified: Secondary | ICD-10-CM | POA: Diagnosis not present

## 2021-08-23 DIAGNOSIS — N1831 Chronic kidney disease, stage 3a: Secondary | ICD-10-CM | POA: Diagnosis not present

## 2021-08-23 DIAGNOSIS — I10 Essential (primary) hypertension: Secondary | ICD-10-CM | POA: Diagnosis not present

## 2021-08-23 DIAGNOSIS — E782 Mixed hyperlipidemia: Secondary | ICD-10-CM | POA: Diagnosis not present

## 2021-09-01 DIAGNOSIS — D125 Benign neoplasm of sigmoid colon: Secondary | ICD-10-CM | POA: Diagnosis not present

## 2021-09-01 DIAGNOSIS — Z86010 Personal history of colonic polyps: Secondary | ICD-10-CM | POA: Diagnosis not present

## 2021-09-01 DIAGNOSIS — D123 Benign neoplasm of transverse colon: Secondary | ICD-10-CM | POA: Diagnosis not present

## 2021-09-01 DIAGNOSIS — K648 Other hemorrhoids: Secondary | ICD-10-CM | POA: Diagnosis not present

## 2021-09-01 DIAGNOSIS — K6389 Other specified diseases of intestine: Secondary | ICD-10-CM | POA: Diagnosis not present

## 2021-09-20 ENCOUNTER — Ambulatory Visit (INDEPENDENT_AMBULATORY_CARE_PROVIDER_SITE_OTHER): Payer: Federal, State, Local not specified - PPO

## 2021-09-20 ENCOUNTER — Ambulatory Visit: Payer: Federal, State, Local not specified - PPO | Admitting: Podiatry

## 2021-09-20 ENCOUNTER — Encounter: Payer: Self-pay | Admitting: Podiatry

## 2021-09-20 DIAGNOSIS — M7732 Calcaneal spur, left foot: Secondary | ICD-10-CM

## 2021-09-20 DIAGNOSIS — M722 Plantar fascial fibromatosis: Secondary | ICD-10-CM | POA: Diagnosis not present

## 2021-09-20 DIAGNOSIS — M779 Enthesopathy, unspecified: Secondary | ICD-10-CM | POA: Diagnosis not present

## 2021-09-20 NOTE — Progress Notes (Signed)
Subjective:   Patient ID: Henry Ball, male   DOB: 56 y.o.   MRN: [id number]   HPI Patient presents with chronic pain in his forefoot left over right into the heel and arch left over right stating that this has become increasingly hard for him he works on cement floors long hours and he is no longer allowed to sit at all at work which did give him some relief   ROS      Objective:  Physical Exam  Neurovascular status intact with inflammation around the lesser MPJs left over right with fluid buildup around the joint surfaces and discomfort extending into the heel region left over right with fluid buildup and into the arch.  Patient has good digital perfusion well oriented     Assessment:  Inflammatory fasciitis of the mid arch left with inflammatory capsulitis of the lesser MPJs and into the heel     Plan:  H&P reviewed condition and I reviewed what would be required.  At this point I do think some sitting would be of benefit to him and we will get him a note as he is able to work effectively and be able to sit some and I did go ahead and I casted him for functional orthotics to try to reduce the stress on his feet.  Patient will be seen back when those are returned and this was done by pedorthist who will work with him on this  X-rays did indicate plantar spur heel formation bilateral moderate depression of the arch no other pathology noted

## 2021-09-20 NOTE — Progress Notes (Signed)
SITUATION Reason for Consult: Evaluation for Bilateral Custom Foot Orthoses Patient / Caregiver Report: Patient is ready for foot orthotics  OBJECTIVE DATA: Patient History / Diagnosis:    ICD-10-CM   1. Plantar fasciitis  M72.2     2. Heel spur, left  M77.32       Current or Previous Devices:   Current user  Foot Examination: Skin presentation:   Intact Ulcers & Callousing:   None Toe / Foot Deformities:  None Weight Bearing Presentation:  Rectus Sensation:    Intact  Shoe Size:    11.5XW  ORTHOTIC RECOMMENDATION Recommended Device: 1x pair of custom functional foot orthotics  GOALS OF ORTHOSES - Reduce Pain - Prevent Foot Deformity - Prevent Progression of Further Foot Deformity - Relieve Pressure - Improve the Overall Biomechanical Function of the Foot and Lower Extremity.  ACTIONS PERFORMED Potential out of pocket cost was communicated to patient. Patient understood and consent to casting. Patient was casted for Foot Orthoses via crush box. Procedure was explained and patient tolerated procedure well. Casts were shipped to central fabrication. All questions were answered and concerns addressed.  PLAN Patient is to be called for fitting when devices are ready.

## 2021-09-21 ENCOUNTER — Encounter: Payer: Self-pay | Admitting: Podiatry

## 2021-10-26 DIAGNOSIS — Z6835 Body mass index (BMI) 35.0-35.9, adult: Secondary | ICD-10-CM | POA: Diagnosis not present

## 2021-10-26 DIAGNOSIS — E669 Obesity, unspecified: Secondary | ICD-10-CM | POA: Diagnosis not present

## 2021-11-01 ENCOUNTER — Ambulatory Visit (INDEPENDENT_AMBULATORY_CARE_PROVIDER_SITE_OTHER): Payer: Federal, State, Local not specified - PPO | Admitting: Podiatry

## 2021-11-01 DIAGNOSIS — M779 Enthesopathy, unspecified: Secondary | ICD-10-CM

## 2021-11-01 DIAGNOSIS — M722 Plantar fascial fibromatosis: Secondary | ICD-10-CM

## 2021-11-01 NOTE — Patient Instructions (Signed)

## 2021-11-01 NOTE — Progress Notes (Signed)
Patient presents today for orthotic pick up and patient voices no new complaints  Orthotics were fitted to patient's feet and no discomfort and no rubbing. Patient satisfied with the orthotics  Orthotics were dispensed to patient with instructions for break in wear and to call the office with any concerns or questions 

## 2022-02-27 DIAGNOSIS — I10 Essential (primary) hypertension: Secondary | ICD-10-CM | POA: Diagnosis not present

## 2022-02-27 DIAGNOSIS — E039 Hypothyroidism, unspecified: Secondary | ICD-10-CM | POA: Diagnosis not present

## 2022-02-27 DIAGNOSIS — Z23 Encounter for immunization: Secondary | ICD-10-CM | POA: Diagnosis not present

## 2022-02-27 DIAGNOSIS — Z0000 Encounter for general adult medical examination without abnormal findings: Secondary | ICD-10-CM | POA: Diagnosis not present

## 2022-02-27 DIAGNOSIS — E782 Mixed hyperlipidemia: Secondary | ICD-10-CM | POA: Diagnosis not present

## 2022-02-27 DIAGNOSIS — N1831 Chronic kidney disease, stage 3a: Secondary | ICD-10-CM | POA: Diagnosis not present

## 2022-06-27 DIAGNOSIS — R051 Acute cough: Secondary | ICD-10-CM | POA: Diagnosis not present

## 2022-06-27 DIAGNOSIS — Z03818 Encounter for observation for suspected exposure to other biological agents ruled out: Secondary | ICD-10-CM | POA: Diagnosis not present

## 2022-06-27 DIAGNOSIS — R509 Fever, unspecified: Secondary | ICD-10-CM | POA: Diagnosis not present

## 2022-06-27 DIAGNOSIS — J069 Acute upper respiratory infection, unspecified: Secondary | ICD-10-CM | POA: Diagnosis not present

## 2022-09-04 DIAGNOSIS — I10 Essential (primary) hypertension: Secondary | ICD-10-CM | POA: Diagnosis not present

## 2022-09-04 DIAGNOSIS — N1831 Chronic kidney disease, stage 3a: Secondary | ICD-10-CM | POA: Diagnosis not present

## 2022-09-04 DIAGNOSIS — G4700 Insomnia, unspecified: Secondary | ICD-10-CM | POA: Diagnosis not present

## 2022-12-07 DIAGNOSIS — I10 Essential (primary) hypertension: Secondary | ICD-10-CM | POA: Diagnosis not present

## 2022-12-07 DIAGNOSIS — E039 Hypothyroidism, unspecified: Secondary | ICD-10-CM | POA: Diagnosis not present

## 2022-12-07 DIAGNOSIS — N1831 Chronic kidney disease, stage 3a: Secondary | ICD-10-CM | POA: Diagnosis not present

## 2022-12-07 DIAGNOSIS — E669 Obesity, unspecified: Secondary | ICD-10-CM | POA: Diagnosis not present

## 2023-03-13 DIAGNOSIS — Z125 Encounter for screening for malignant neoplasm of prostate: Secondary | ICD-10-CM | POA: Diagnosis not present

## 2023-03-13 DIAGNOSIS — N1831 Chronic kidney disease, stage 3a: Secondary | ICD-10-CM | POA: Diagnosis not present

## 2023-03-13 DIAGNOSIS — K219 Gastro-esophageal reflux disease without esophagitis: Secondary | ICD-10-CM | POA: Diagnosis not present

## 2023-03-13 DIAGNOSIS — I10 Essential (primary) hypertension: Secondary | ICD-10-CM | POA: Diagnosis not present

## 2023-03-13 DIAGNOSIS — Z0000 Encounter for general adult medical examination without abnormal findings: Secondary | ICD-10-CM | POA: Diagnosis not present

## 2023-03-13 DIAGNOSIS — E782 Mixed hyperlipidemia: Secondary | ICD-10-CM | POA: Diagnosis not present

## 2023-03-13 DIAGNOSIS — E039 Hypothyroidism, unspecified: Secondary | ICD-10-CM | POA: Diagnosis not present

## 2023-06-21 ENCOUNTER — Ambulatory Visit (INDEPENDENT_AMBULATORY_CARE_PROVIDER_SITE_OTHER): Payer: Federal, State, Local not specified - PPO

## 2023-06-21 ENCOUNTER — Ambulatory Visit: Payer: Federal, State, Local not specified - PPO | Admitting: Podiatry

## 2023-06-21 DIAGNOSIS — G629 Polyneuropathy, unspecified: Secondary | ICD-10-CM

## 2023-06-21 DIAGNOSIS — M79671 Pain in right foot: Secondary | ICD-10-CM

## 2023-06-21 DIAGNOSIS — M79672 Pain in left foot: Secondary | ICD-10-CM

## 2023-06-21 DIAGNOSIS — M79604 Pain in right leg: Secondary | ICD-10-CM | POA: Diagnosis not present

## 2023-06-21 DIAGNOSIS — M79605 Pain in left leg: Secondary | ICD-10-CM

## 2023-06-21 DIAGNOSIS — M779 Enthesopathy, unspecified: Secondary | ICD-10-CM | POA: Diagnosis not present

## 2023-06-21 MED ORDER — GABAPENTIN 400 MG PO CAPS: 400.0000 mg | ORAL_CAPSULE | Freq: Three times a day (TID) | ORAL | 3 refills | Status: AC

## 2023-06-22 NOTE — Progress Notes (Signed)
 Subjective:   Patient ID: Henry Ball, male   DOB: 58 y.o.   MRN: [id number]   HPI Patient presents stating that he feels like he has had an increase in burning and tingling in his feet that has gotten worse over the last several months.  States he is not taking anything for it he does have orthotics which helped some but still has problems and has made shoe gear modifications   ROS      Objective:  Physical Exam  Vascular status intact neurologically mild diminishment sharp dull vibratory with range of motion adequate.  Patient is found to have generalized symptoms and does work long hours cement surfaces     Assessment:  Very difficult to tell between inflammatory and neuropathic type condition with the patient doing a good job with shoe gear choices and also wearing orthotics     Plan:  H&P reviewed both conditions great length discussed treatment options and at this point I am going to start him on gabapentin 400 mg I want him to take 1 at night and if tolerated can add 1 morning 1 midday.  We will see him back as needed may consider other treatments depending on response  X-rays dated today indicate no signs of arthritis or indications of bony structural issues that are part of the symptoms the patient is experiencing.  Advised him on this

## 2023-07-04 ENCOUNTER — Encounter: Payer: Self-pay | Admitting: Podiatry

## 2023-07-04 ENCOUNTER — Ambulatory Visit: Admitting: Podiatry

## 2023-07-04 DIAGNOSIS — M7752 Other enthesopathy of left foot: Secondary | ICD-10-CM | POA: Diagnosis not present

## 2023-07-04 DIAGNOSIS — G629 Polyneuropathy, unspecified: Secondary | ICD-10-CM | POA: Diagnosis not present

## 2023-07-04 DIAGNOSIS — M7751 Other enthesopathy of right foot: Secondary | ICD-10-CM | POA: Diagnosis not present

## 2023-07-04 MED ORDER — TRIAMCINOLONE ACETONIDE 10 MG/ML IJ SUSP
10.0000 mg | Freq: Once | INTRAMUSCULAR | Status: AC
  Administered 2023-07-04: 10 mg via INTRA_ARTICULAR

## 2023-07-04 NOTE — Progress Notes (Signed)
 Subjective:   Patient ID: Henry Ball, male   DOB: 58 y.o.   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

## 2023-09-10 DIAGNOSIS — M79671 Pain in right foot: Secondary | ICD-10-CM | POA: Diagnosis not present

## 2023-09-10 DIAGNOSIS — E782 Mixed hyperlipidemia: Secondary | ICD-10-CM | POA: Diagnosis not present

## 2023-09-10 DIAGNOSIS — N1831 Chronic kidney disease, stage 3a: Secondary | ICD-10-CM | POA: Diagnosis not present

## 2023-09-10 DIAGNOSIS — M109 Gout, unspecified: Secondary | ICD-10-CM | POA: Diagnosis not present

## 2023-09-10 DIAGNOSIS — E039 Hypothyroidism, unspecified: Secondary | ICD-10-CM | POA: Diagnosis not present

## 2023-09-10 DIAGNOSIS — I10 Essential (primary) hypertension: Secondary | ICD-10-CM | POA: Diagnosis not present

## 2023-10-24 ENCOUNTER — Encounter: Payer: Self-pay | Admitting: Podiatry

## 2023-10-24 ENCOUNTER — Ambulatory Visit: Admitting: Podiatry

## 2023-10-24 DIAGNOSIS — G629 Polyneuropathy, unspecified: Secondary | ICD-10-CM

## 2023-10-24 DIAGNOSIS — M1A0790 Idiopathic chronic gout, unspecified ankle and foot, without tophus (tophi): Secondary | ICD-10-CM

## 2023-10-24 MED ORDER — ALLOPURINOL 100 MG PO TABS: 100.0000 mg | ORAL_TABLET | Freq: Every day | ORAL | 6 refills | Status: AC

## 2023-10-24 NOTE — Progress Notes (Signed)
 Subjective:   Patient ID: Henry Ball, male   DOB: 58 y.o.   MRN: [id number]   HPI Patient presents stating that the injections did not make a big difference for him and he started allopurinol which seems to be helpful but still has problems   ROS      Objective:  Physical Exam  Neurovascular status unchanged from previous visits with patient noted to have discomfort still present with a diffuse slight pain but the initial prescription of allopurinol seem to help him     Assessment:  I reviewed at great length gout and I discussed it is possibility versus neuropathy versus other inflammatory condition     Plan:  H&P reviewed and I am going to go ahead and write him for more allopurinol and he is to follow up with his family physician and I would like uric acid to be repeated along with C-reactive protein and sed rate and I gave him those instructions.  Patient may require another medication and I did give him a sheet today discussing foods that I want him to be careful with

## 2023-10-24 NOTE — Patient Instructions (Signed)
 Low-Purine Eating Plan A low-purine eating plan involves making food choices to limit your purine intake. Purine is a kind of uric acid. Too much uric acid in your blood can cause certain conditions, such as gout and kidney stones. Eating a low-purine diet may help control these conditions. What are tips for following this plan? Shopping Avoid buying products that contain high-fructose corn syrup. Check for this on food labels. It is commonly found in many processed foods and soft drinks. Be sure to check for it in baked goods such as cookies, canned fruits, and cereals and cereal bars. Avoid buying veal, chicken breast with skin, lamb, and organ meats such as liver. These types of meats tend to have the highest purine content. Choose dairy products. These may lower uric acid levels. Avoid certain types of fish. Not all fish and seafood have high purine content. Examples with high purine content include anchovies, trout, tuna, sardines, and salmon. Avoid buying beverages that contain alcohol, particularly beer and hard liquor. Alcohol can affect the way your body gets rid of uric acid. Meal planning  Learn which foods do or do not affect you. If you find out that a food tends to cause your gout symptoms to flare up, avoid eating that food. You can enjoy foods that do not cause problems. If you have any questions about a food item, talk with your dietitian or health care provider. Reduce the overall amount of meat in your diet. When you do eat meat, choose ones with lower purine content. Include plenty of fruits and vegetables. Although some vegetables may have a high purine content--such as asparagus, mushrooms, spinach, or cauliflower--it has been shown that these do not contribute to uric acid blood levels as much. Consume at least 1 dairy serving a day. This has been shown to decrease uric acid levels. General information If you drink alcohol: Limit how much you have to: 0-1 drink a day for  women who are not pregnant. 0-2 drinks a day for men. Know how much alcohol is in a drink. In the [place] one drink equals one 12 oz bottle of beer (355 mL), one 5 oz glass of wine (148 mL), or one 1 oz glass of hard liquor (44 mL). Drink plenty of water. Try to drink enough to keep your urine pale yellow. Fluids can help remove uric acid from your body. Work with your health care provider and dietitian to develop a plan to achieve or maintain a healthy weight. Losing weight may help reduce uric acid in your blood. What foods are recommended? The following are some types of foods that are good choices when limiting purine intake: Fresh or frozen fruits and vegetables. Whole grains, breads, cereals, and pasta. Rice. Beans, peas, legumes. Nuts and seeds. Dairy products. Fats and oils. The items listed above may not be a complete list. Talk with a dietitian about what dietary choices are best for you. What foods are not recommended? Limit your intake of foods high in purines, including: Beer and other alcohol. Meat-based gravy or sauce. Canned or fresh fish, such as: Anchovies, sardines, herring, salmon, and tuna. Mussels and scallops. Codfish, trout, and haddock. Bacon, veal, chicken breast with skin, and lamb. Organ meats, such as: Liver or kidney. Tripe. Sweetbreads (thymus gland or pancreas). Wild [employer] officer, environmental. Yeast or yeast extract supplements. Drinks sweetened with high-fructose corn syrup, such as soda. Processed foods made with high-fructose corn syrup. The items listed above may not be a complete list of foods  and beverages you should limit. Contact a dietitian for more information. Summary Eating a low-purine diet may help control conditions caused by too much uric acid in the body, such as gout or kidney stones. Choose low-purine foods, limit alcohol, and limit high-fructose corn syrup. You will learn over time which foods do or do not affect you. If you find out that a  food tends to cause your gout symptoms to flare up, avoid eating that food. This information is not intended to replace advice given to you by your health care provider. Make sure you discuss any questions you have with your health care provider. Document Revised: 03/30/2021 Document Reviewed: 03/30/2021 Elsevier Patient Education  2025 [employer].

## 2024-03-17 DIAGNOSIS — N1831 Chronic kidney disease, stage 3a: Secondary | ICD-10-CM | POA: Diagnosis not present

## 2024-03-17 DIAGNOSIS — E291 Testicular hypofunction: Secondary | ICD-10-CM | POA: Diagnosis not present

## 2024-03-17 DIAGNOSIS — E782 Mixed hyperlipidemia: Secondary | ICD-10-CM | POA: Diagnosis not present

## 2024-03-17 DIAGNOSIS — N400 Enlarged prostate without lower urinary tract symptoms: Secondary | ICD-10-CM | POA: Diagnosis not present

## 2024-03-17 DIAGNOSIS — K219 Gastro-esophageal reflux disease without esophagitis: Secondary | ICD-10-CM | POA: Diagnosis not present

## 2024-03-17 DIAGNOSIS — E669 Obesity, unspecified: Secondary | ICD-10-CM | POA: Diagnosis not present

## 2024-03-17 DIAGNOSIS — K589 Irritable bowel syndrome without diarrhea: Secondary | ICD-10-CM | POA: Diagnosis not present

## 2024-03-17 DIAGNOSIS — I10 Essential (primary) hypertension: Secondary | ICD-10-CM | POA: Diagnosis not present

## 2024-03-17 DIAGNOSIS — Z0000 Encounter for general adult medical examination without abnormal findings: Secondary | ICD-10-CM | POA: Diagnosis not present

## 2024-03-17 DIAGNOSIS — M109 Gout, unspecified: Secondary | ICD-10-CM | POA: Diagnosis not present

## 2024-04-11 DIAGNOSIS — J019 Acute sinusitis, unspecified: Secondary | ICD-10-CM | POA: Diagnosis not present
